# Patient Record
Sex: Male | Born: 1990 | Race: Black or African American | Hispanic: No | Marital: Married | Smoking: Never smoker
Health system: Southern US, Community
[De-identification: ages and names within clinical notes are randomized; demographics above are authoritative.]

---
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
## 2019-02-01 ENCOUNTER — Emergency Department (HOSPITAL_COMMUNITY)
Admission: EM | Admit: 2019-02-01 | Discharge: 2019-02-01 | Disposition: A | Discharge status: Home / Self Care | Payer: BC Managed Care – PPO | Attending: Emergency Medicine | Admitting: Emergency Medicine

## 2019-02-01 ENCOUNTER — Emergency Department (HOSPITAL_COMMUNITY): Payer: BC Managed Care – PPO

## 2019-02-01 ENCOUNTER — Encounter (HOSPITAL_COMMUNITY): Payer: Self-pay | Admitting: Emergency Medicine

## 2019-02-01 ENCOUNTER — Other Ambulatory Visit: Payer: Self-pay

## 2019-02-01 DIAGNOSIS — B349 Viral infection, unspecified: Secondary | ICD-10-CM | POA: Diagnosis not present

## 2019-02-01 DIAGNOSIS — R112 Nausea with vomiting, unspecified: Secondary | ICD-10-CM | POA: Insufficient documentation

## 2019-02-01 DIAGNOSIS — R17 Unspecified jaundice: Secondary | ICD-10-CM | POA: Diagnosis not present

## 2019-02-01 DIAGNOSIS — Z20828 Contact with and (suspected) exposure to other viral communicable diseases: Secondary | ICD-10-CM | POA: Diagnosis not present

## 2019-02-01 DIAGNOSIS — R079 Chest pain, unspecified: Secondary | ICD-10-CM | POA: Diagnosis present

## 2019-02-01 LAB — CBC
HCT: 45.7 % (ref 39.0–52.0)
Hemoglobin: 15.4 g/dL (ref 13.0–17.0)
MCH: 29.1 pg (ref 26.0–34.0)
MCHC: 33.7 g/dL (ref 30.0–36.0)
MCV: 86.4 fL (ref 80.0–100.0)
Platelets: 248 10*3/uL (ref 150–400)
RBC: 5.29 MIL/uL (ref 4.22–5.81)
RDW: 12.6 % (ref 11.5–15.5)
WBC: 5.4 10*3/uL (ref 4.0–10.5)
nRBC: 0 % (ref 0.0–0.2)

## 2019-02-01 LAB — COMPREHENSIVE METABOLIC PANEL
ALT: 23 U/L (ref 0–44)
AST: 31 U/L (ref 15–41)
Albumin: 4.5 g/dL (ref 3.5–5.0)
Alkaline Phosphatase: 44 U/L (ref 38–126)
Anion gap: 12 (ref 5–15)
BUN: 11 mg/dL (ref 6–20)
CO2: 20 mmol/L — ABNORMAL LOW (ref 22–32)
Calcium: 9.2 mg/dL (ref 8.9–10.3)
Chloride: 106 mmol/L (ref 98–111)
Creatinine, Ser: 1.09 mg/dL (ref 0.61–1.24)
GFR calc Af Amer: >60 mL/min (ref >60)
GFR calc non Af Amer: >60 mL/min (ref >60)
Glucose, Bld: 112 mg/dL — ABNORMAL HIGH (ref 70–99)
Potassium: 3.4 mmol/L — ABNORMAL LOW (ref 3.5–5.1)
Sodium: 138 mmol/L (ref 135–145)
Total Bilirubin: 2.7 mg/dL — ABNORMAL HIGH (ref 0.3–1.2)
Total Protein: 7.7 g/dL (ref 6.5–8.1)

## 2019-02-01 LAB — TROPONIN I (HIGH SENSITIVITY): Troponin I (High Sensitivity): <2 ng/L (ref <18)

## 2019-02-01 LAB — LIPASE, BLOOD: Lipase: 27 U/L (ref 11–51)

## 2019-02-01 MED ORDER — BENZONATATE 100 MG PO CAPS: 100.0000 mg | ORAL_CAPSULE | Freq: Three times a day (TID) | ORAL | 0 refills | Status: DC

## 2019-02-01 MED ORDER — ONDANSETRON 8 MG PO TBDP
8.0000 mg | ORAL_TABLET | Freq: Once | ORAL | Status: AC | PRN
  Administered 2019-02-01: 8 mg via ORAL
  Filled 2019-02-01: qty 1

## 2019-02-01 MED ORDER — ONDANSETRON 8 MG PO TBDP: 8.0000 mg | ORAL_TABLET | Freq: Three times a day (TID) | ORAL | 0 refills | Status: DC | PRN

## 2019-02-01 MED ORDER — ONDANSETRON 4 MG PO TBDP: 4.0000 mg | ORAL_TABLET | Freq: Once | ORAL | Status: DC | PRN

## 2019-02-01 NOTE — ED Provider Notes (Signed)
[facility]-EMERGENCY DEPT Provider Note   CSN: [id number] Arrival date & time: 02/01/19  1057     History   Chief Complaint Chief Complaint  Patient presents with  . Chest Pain  . Chills  . Cough  . Shortness of Breath  . Nausea    HPI James Wolfe is a 28 y.o. male.     HPI James Wolfe is a 28 y.o. male presents to emergency department with complaint of chest pain, nausea, vomiting, shortness of breath, chills onset this morning.  He denies any fever but states he felt feverish.  He denies any abdominal pain.  No hemoptysis.  No diarrhea.  He does report cough, dry, nonproductive.  No nasal congestion or sore throat.  He admitted to the nurse at triage that he drank alcohol last night, he denies this to me.  He denies sick contacts.  No close contact with anybody with COVID-19.  No medications taken prior to coming in.  No past medical problems.  History reviewed. No pertinent past medical history.  There are no active problems to display for this patient.   History reviewed. No pertinent surgical history.      Home Medications    Prior to Admission medications   Not on File    Family History No family history on file.  Social History Social History   Tobacco Use  . Smoking status: Never Smoker  . Smokeless tobacco: Never Used  Substance Use Topics  . Alcohol use: Yes  . Drug use: Yes    Types: Marijuana     Allergies   Patient has no known allergies.   Review of Systems Review of Systems  Constitutional: Positive for chills. Negative for fever.  HENT: Negative for congestion and sore throat.   Respiratory: Positive for cough. Negative for chest tightness and shortness of breath.   Cardiovascular: Positive for chest pain. Negative for palpitations and leg swelling.  Gastrointestinal: Positive for nausea. Negative for abdominal distention, abdominal pain, diarrhea and vomiting.  Genitourinary: Negative for dysuria, frequency,  hematuria and urgency.  Musculoskeletal: Positive for myalgias. Negative for neck pain and neck stiffness.  Skin: Negative for rash.  Allergic/Immunologic: Negative for immunocompromised state.  Neurological: Positive for headaches. Negative for dizziness, weakness, light-headedness and numbness.     Physical Exam Updated Vital Signs BP (!) 137/106 (BP Location: Left Arm)   Pulse 80   Temp 98.4 F (36.9 C) (Oral)   Resp 20   SpO2 100%   Physical Exam Vitals signs and nursing note reviewed.  Constitutional:      General: He is not in acute distress.    Appearance: He is well-developed.  HENT:     Head: Normocephalic and atraumatic.  Eyes:     Conjunctiva/sclera: Conjunctivae normal.  Neck:     Musculoskeletal: Neck supple.  Cardiovascular:     Rate and Rhythm: Normal rate and regular rhythm.     Heart sounds: Normal heart sounds.  Pulmonary:     Effort: Pulmonary effort is normal. No respiratory distress.     Breath sounds: No wheezing or rales.  Abdominal:     General: Bowel sounds are normal. There is no distension.     Palpations: Abdomen is soft.     Tenderness: There is no abdominal tenderness. There is no rebound.  Skin:    General: Skin is warm and dry.  Neurological:     Mental Status: He is alert.      ED Treatments /  Results  Labs (all labs ordered are listed, but only abnormal results are displayed) Labs Reviewed  COMPREHENSIVE METABOLIC PANEL - Abnormal; Notable for the following components:      Result Value   Potassium 3.4 (*)    CO2 20 (*)    Glucose, Bld 112 (*)    Total Bilirubin 2.7 (*)    All other components within normal limits  NOVEL CORONAVIRUS, NAA (HOSP ORDER, SEND-OUT TO REF LAB; TAT 18-24 HRS)  CBC  LIPASE, BLOOD  TROPONIN I (HIGH SENSITIVITY)  TROPONIN I (HIGH SENSITIVITY)    EKG EKG Interpretation  Date/Time:  Sunday February 01 2019 11:10:17 EDT Ventricular Rate:  81 PR Interval:    QRS Duration: 93 QT Interval:  392  QTC Calculation: 455 R Axis:   90 Text Interpretation:  Sinus rhythm Probable left atrial enlargement Consider right ventricular hypertrophy ST elev, probable normal early repol pattern Confirmed by Kohut, Stephen ([id number]) on 02/01/2019 1:05:49 PM   Radiology Dg Chest Portable 1 View  Result Date: 02/01/2019 CLINICAL DATA:  Chest pain, shortness of breath. EXAM: PORTABLE CHEST 1 VIEW COMPARISON:  None. FINDINGS: The heart size and mediastinal contours are within normal limits. Both lungs are clear. No pneumothorax or pleural effusion is noted. The visualized skeletal structures are unremarkable. IMPRESSION: No active disease. Electronically Signed   By: James  Green Jr M.D.   On: 02/01/2019 12:21    Procedures Procedures (including critical care time)  Medications Ordered in ED Medications  ondansetron (ZOFRAN-ODT) disintegrating tablet 8 mg (has no administration in time range)     Initial Impression / Assessment and Plan / ED Course  I have reviewed the triage vital signs and the nursing notes.  Pertinent labs & imaging results that were available during my care of the patient were reviewed by me and considered in my medical decision making (see chart for details).        12 :38 PM Patient seen and examined, patient with body aches, chills, cough, nausea, vomiting onset this morning.  Exam unremarkable.  He is slightly hypertensive, otherwise normal vital signs with no hypoxia or tachycardia.  He is afebrile.  Labs already obtained at triage including troponin, CBC, CMP, lipase.  We will continue to monitor patient, Zofran ordered for nausea.  Labs pending.  1:43 PM Labs show slight hypokalemia and elevated bilirubin 2.7, normal LFTs. Norma CBC. CXR negative as well. VS remain stable. COVID test obtained and pending. Pt stable to dc home. Most likely viral URI. Home with zofran and tessalon. Close return precautions discussed. Pt instructed to quarantine.   James Wolfe was  evaluated in Emergency Department on 02/01/2019 for the symptoms described in the history of present illness. He was evaluated in the context of the global COVID-19 pandemic, which necessitated consideration that the patient might be at risk for infection with the SARS-CoV-2 virus that causes COVID-19. Institutional protocols and algorithms that pertain to the evaluation of patients at risk for COVID-19 are in a state of rapid change based on information released by regulatory bodies including the CDC and federal and state organizations. These policies and algorithms were followed during the patient's care in the ED.  Vitals:   02/01/19 1112 02/01/19 1343  BP: (!) 137/106 128/88  Pulse: 80 72  Resp: 20 16  Temp: 98.4 F (36.9 C)   TempSrc: Oral   SpO2: 100% 100%      Final Clinical Impressions(s) / ED Diagnoses   Final diagnoses:  Viral syndrome  Non-intractable  vomiting with nausea, unspecified vomiting type  Elevated bilirubin    ED Discharge Orders         Ordered    ondansetron (ZOFRAN ODT) 8 MG disintegrating tablet  Every 8 hours PRN     02/01/19 1347    benzonatate (TESSALON) 100 MG capsule  Every 8 hours     02/01/19 1347           Jaynie CrumbleKirichenko, Jerimah Witucki, PA-C 02/01/19 1348    Raeford RazorKohut, Stephen, MD 02/01/19 1408

## 2019-02-01 NOTE — Discharge Instructions (Signed)
Tessalon as needed for cough. Zofran for nausea and vomiting. Rest. Drink plenty of fluids. Your bilirubin was up a little. Please have it rechecked with PCP in 3-4 wks. Return as needed

## 2019-02-01 NOTE — ED Triage Notes (Signed)
Pt c/o intermittent sharp chest pains that been intermittent over past couple weeks and  Today having SOB, chills, cough congestion.

## 2019-02-01 NOTE — ED Triage Notes (Signed)
Pt adds that he dry heaving. Pt reports that he did drink couple shots last night

## 2019-02-02 LAB — NOVEL CORONAVIRUS, NAA (HOSP ORDER, SEND-OUT TO REF LAB; TAT 18-24 HRS): SARS-CoV-2, NAA: NOT DETECTED

## 2019-12-06 ENCOUNTER — Ambulatory Visit (HOSPITAL_COMMUNITY)
Admission: EM | Admit: 2019-12-06 | Discharge: 2019-12-06 | Disposition: A | Discharge status: Home / Self Care | Payer: BC Managed Care – PPO

## 2019-12-06 ENCOUNTER — Other Ambulatory Visit: Payer: Self-pay

## 2019-12-06 NOTE — ED Notes (Signed)
James Wolfe, patient access, reports patient said they were leaving after told the expected wait time

## 2020-09-14 IMAGING — DX DG CHEST 1V PORT
1 series · 1 of 1 positions shown · non-contrast
Comparison: None.

CLINICAL DATA: Chest pain, shortness of breath.

EXAM:
PORTABLE CHEST 1 VIEW

[chest ap]
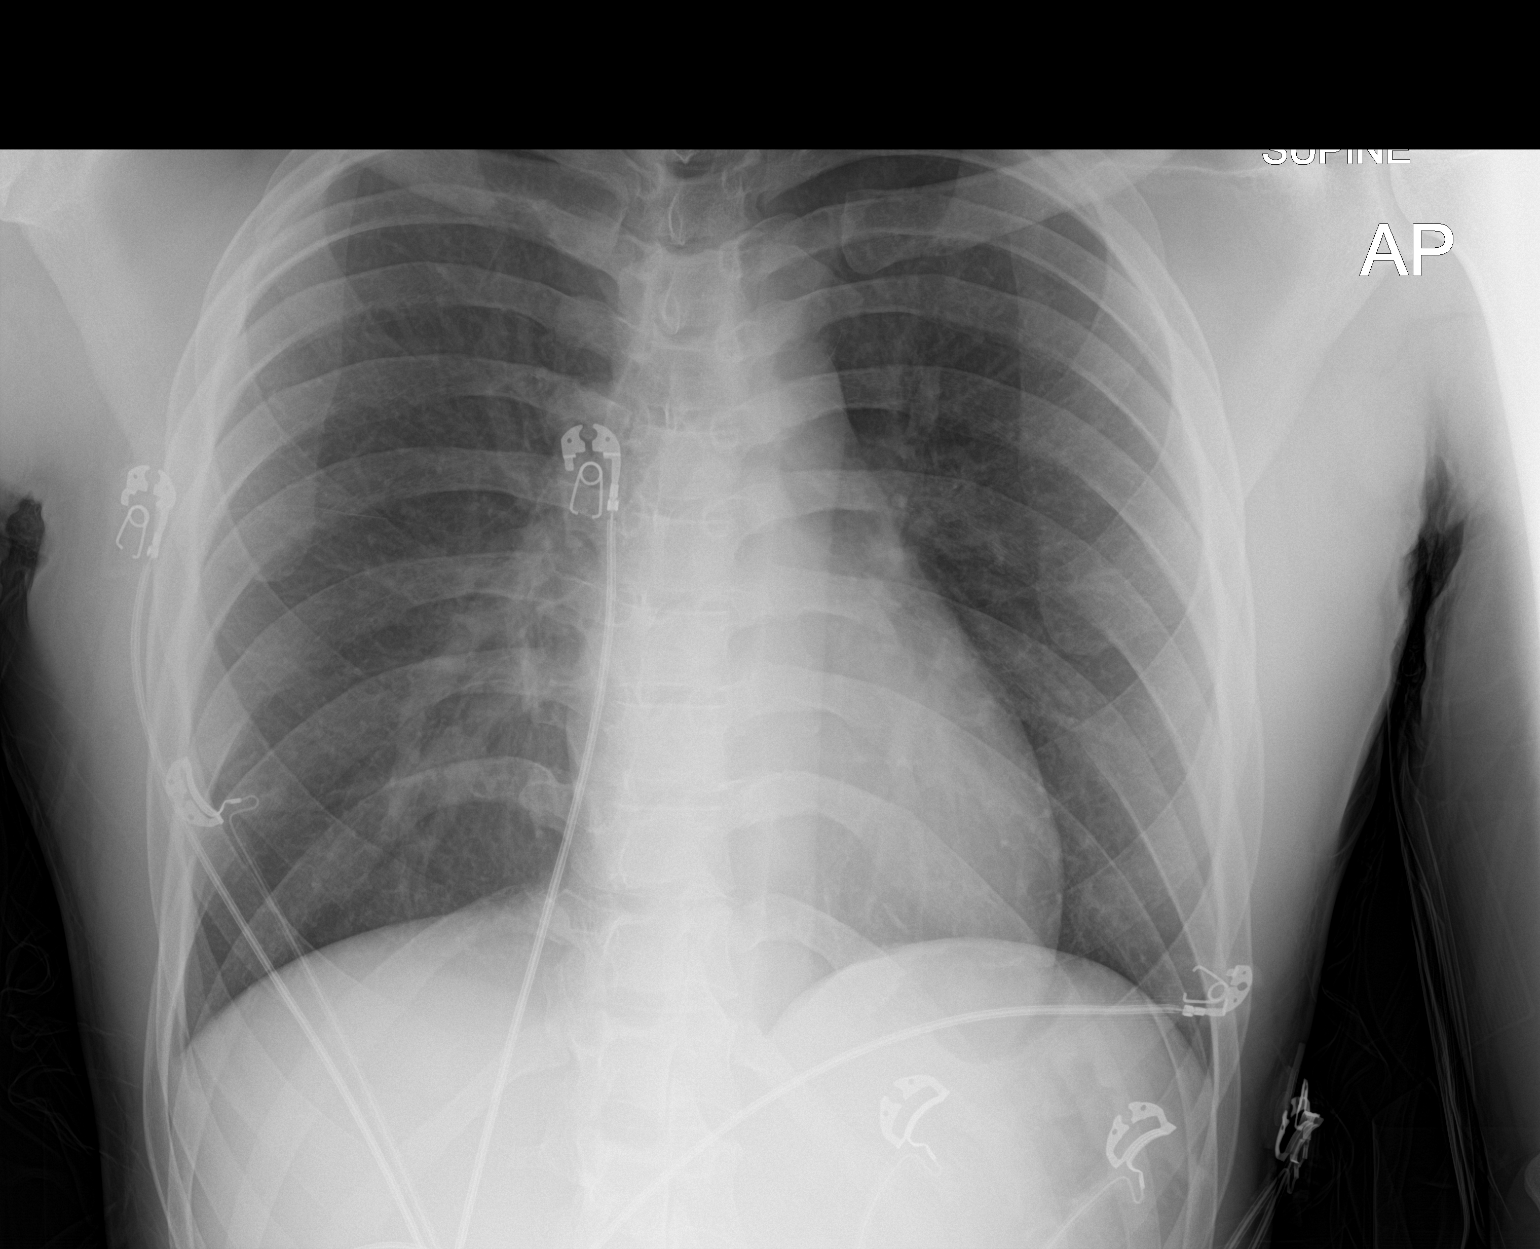

[1 of 1 positions shown; findings below may reference images not displayed]

FINDINGS: The heart size and mediastinal contours are within normal limits.
Both lungs are clear. No pneumothorax or pleural effusion is noted.
The visualized skeletal structures are unremarkable.
IMPRESSION: No active disease.

## 2021-06-23 ENCOUNTER — Other Ambulatory Visit: Payer: Self-pay

## 2021-06-23 ENCOUNTER — Emergency Department (HOSPITAL_COMMUNITY)
Admission: EM | Admit: 2021-06-23 | Discharge: 2021-06-23 | Disposition: A | Discharge status: Home / Self Care | Payer: BC Managed Care – PPO | Attending: Emergency Medicine | Admitting: Emergency Medicine

## 2021-06-23 ENCOUNTER — Emergency Department (HOSPITAL_COMMUNITY): Payer: BC Managed Care – PPO

## 2021-06-23 ENCOUNTER — Encounter (HOSPITAL_COMMUNITY): Payer: Self-pay | Admitting: Emergency Medicine

## 2021-06-23 DIAGNOSIS — J069 Acute upper respiratory infection, unspecified: Secondary | ICD-10-CM | POA: Diagnosis not present

## 2021-06-23 DIAGNOSIS — Z20822 Contact with and (suspected) exposure to covid-19: Secondary | ICD-10-CM | POA: Insufficient documentation

## 2021-06-23 DIAGNOSIS — J3489 Other specified disorders of nose and nasal sinuses: Secondary | ICD-10-CM | POA: Diagnosis not present

## 2021-06-23 DIAGNOSIS — R059 Cough, unspecified: Secondary | ICD-10-CM | POA: Diagnosis present

## 2021-06-23 LAB — RESP PANEL BY RT-PCR (FLU A&B, COVID) ARPGX2
Influenza A by PCR: NEGATIVE
Influenza B by PCR: NEGATIVE
SARS Coronavirus 2 by RT PCR: NEGATIVE

## 2021-06-23 MED ORDER — BENZONATATE 200 MG PO CAPS: 200.0000 mg | ORAL_CAPSULE | Freq: Three times a day (TID) | ORAL | 0 refills | Status: DC | PRN

## 2021-06-23 MED ORDER — ACETAMINOPHEN 500 MG PO TABS
1000.0000 mg | ORAL_TABLET | Freq: Once | ORAL | Status: AC
  Administered 2021-06-23: 1000 mg via ORAL
  Filled 2021-06-23: qty 2

## 2021-06-23 NOTE — Discharge Instructions (Addendum)
Your symptoms are likely caused by a viral upper respiratory infection. Antibiotics are not helpful in treating viral infection, the virus should run its course in about 5-7 days. Please make sure you are drinking plenty of fluids. You can treat your symptoms supportively with tylenol/ibuprofen for fevers and pains, Zyrtec and Flonase to help with nasal congestion, and prescribed cough medicine and throat lozenges to help with cough. If your symptoms are not improving please follow up with you Primary doctor.  ° °If you develop persistent fevers, shortness of breath or difficulty breathing, chest pain, severe headache and neck pain, persistent nausea and vomiting or other new or concerning symptoms return to the Emergency department. ° °

## 2021-06-23 NOTE — ED Provider Notes (Signed)
?[facility] EMERGENCY DEPARTMENT ?Provider Note ? ? ?CSN: [id number] ?Arrival date & time: 06/23/21  0037 ? ?  ? ?History ? ?Chief Complaint  ?Patient presents with  ? URI  ? ? ?James Wolfe is a 31 y.o. male. ? ?James Wolfe is a 31 y.o. male  ? ?The history is provided by the patient.  ? ?  ? ?Home Medications ?Prior to Admission medications   ?Medication Sig Start Date End Date Taking? Authorizing Provider  ?benzonatate (TESSALON) 200 MG capsule Take 1 capsule (200 mg total) by mouth 3 (three) times daily as needed for cough. 06/23/21  Yes Dartha Lodge, PA-C  ?ondansetron (ZOFRAN ODT) 8 MG disintegrating tablet Take 1 tablet (8 mg total) by mouth every 8 (eight) hours as needed for nausea or vomiting. 02/01/19   Jaynie Crumble, PA-C  ?   ? ?Allergies    ?Patient has no known allergies.   ? ?Review of Systems   ?Review of Systems  ?Constitutional:  Positive for chills. Negative for fever.  ?HENT:  Positive for congestion and rhinorrhea. Negative for sore throat.   ?Respiratory:  Positive for cough. Negative for shortness of breath and wheezing.   ?Cardiovascular:  Negative for chest pain.  ?Musculoskeletal:  Positive for myalgias.  ?Neurological:  Positive for headaches.  ?All other systems reviewed and are negative. ? ?Physical Exam ?Updated Vital Signs ?BP (!) 144/76 (BP Location: Right Arm)   Pulse 68   Temp 98.4 ?F (36.9 ?C) (Oral)   Resp 14   Ht 6' (1.829 m)   Wt 83.9 kg   SpO2 97%   BMI 25.09 kg/m?  ?Physical Exam ?Vitals and nursing note reviewed.  ?Constitutional:   ?   General: He is not in acute distress. ?   Appearance: Normal appearance. He is well-developed and normal weight. He is not ill-appearing or diaphoretic.  ?HENT:  ?   Head: Normocephalic and atraumatic.  ?   Nose: Congestion and rhinorrhea present.  ?   Mouth/Throat:  ?   Mouth: Mucous membranes are moist.  ?   Pharynx: Oropharynx is clear.  ?Eyes:  ?   General:     ?   Right eye: No discharge.     ?   Left eye: No  discharge.  ?Neck:  ?   Comments: No rigidity ?Cardiovascular:  ?   Rate and Rhythm: Normal rate and regular rhythm.  ?   Heart sounds: Normal heart sounds. No murmur heard. ?  No friction rub. No gallop.  ?Pulmonary:  ?   Effort: Pulmonary effort is normal. No respiratory distress.  ?   Breath sounds: Normal breath sounds.  ?   Comments: Respirations equal and unlabored, patient able to speak in full sentences, lungs clear to auscultation bilaterally  ?Abdominal:  ?   General: Bowel sounds are normal. There is no distension.  ?   Palpations: Abdomen is soft. There is no mass.  ?   Tenderness: There is no abdominal tenderness. There is no guarding.  ?   Comments: Abdomen soft, nondistended, nontender to palpation in all quadrants without guarding or peritoneal signs  ?Musculoskeletal:     ?   General: No deformity.  ?   Cervical back: Neck supple.  ?Lymphadenopathy:  ?   Cervical: No cervical adenopathy.  ?Skin: ?   General: Skin is warm and dry.  ?   Capillary Refill: Capillary refill takes less than 2 seconds.  ?Neurological:  ?   Mental Status: He  is alert and oriented to person, place, and time.  ?Psychiatric:     ?   Mood and Affect: Mood normal.     ?   Behavior: Behavior normal.  ? ? ?ED Results / Procedures / Treatments   ?Labs ?(all labs ordered are listed, but only abnormal results are displayed) ?Labs Reviewed  ?RESP PANEL BY RT-PCR (FLU A&B, COVID) [id number]  ? ? ?EKG ?None ? ?Radiology ?DG Chest 2 View ? ?Result Date: 06/23/2021 ?CLINICAL DATA:  Cough. EXAM: CHEST - 2 VIEW COMPARISON:  Chest radiograph dated 02/01/2019. FINDINGS: The heart size and mediastinal contours are within normal limits. Both lungs are clear. The visualized skeletal structures are unremarkable. IMPRESSION: No active cardiopulmonary disease. Electronically Signed   By: Elgie Collard M.D.   On: 06/23/2021 01:33   ? ?Procedures ?Procedures  ? ? ?Medications Ordered in ED ?Medications  ?acetaminophen (TYLENOL) tablet 1,000 mg (1,000 mg  Oral Given 06/23/21 0053)  ? ? ?ED Course/ Medical Decision Making/ A&P ?  ?                        ?This patient presents to the ED for concern of cough, congestion, chills, body aches, this involves an extensive number of treatment options, and is a complaint that carries with it a high risk of complications and morbidity.  The differential diagnosis includes COVID, flu, other viral upper respiratory infection, bronchitis, pneumonia ? ? ?Additional history obtained: ? ?Additional history obtained from significant other  ?External records from outside source obtained and reviewed including prior urgent care and ED visits. ? ? ?Lab Tests: ? ?I Ordered, and personally interpreted labs.  The pertinent results include:  neg for COVID and Flu ? ? ?Imaging Studies ordered: ? ?I ordered imaging studies including CXR  ?I independently visualized and interpreted imaging which showed no evidence of pneumonia ?I agree with the radiologist interpretation ? ? ?Medicines ordered and prescription drug management: ? ?I ordered medication including tylenol  for bodyaches  ?Reevaluation of the patient after these medicines showed that the patient improved ?I have reviewed the patients home medicines and have made adjustments as needed ? ? ? ?Problem List / ED Course: ? ?Presentation consistent with viral upper respiratory infection, Work-up is reassuring and vitals are normal. ?Stable for discharge home with continued symptomatic treatment and outpatient follow-up. ? ? ? ? ? ? ? ?Final Clinical Impression(s) / ED Diagnoses ?Final diagnoses:  ?Viral URI with cough  ? ? ?Rx / DC Orders ?ED Discharge Orders   ? ?      Ordered  ?  benzonatate (TESSALON) 200 MG capsule  3 times daily PRN       ? 06/23/21 0428  ? ?  ?  ? ?  ? ? ?  ?Dartha Lodge, [place] ?06/23/21 3016 ? ?  ?Nira Conn, MD ?06/23/21 [phone] ? ?

## 2021-06-23 NOTE — ED Triage Notes (Signed)
Pt reported to ED with c/o cough, weakness, body aches, chills for past few days. Pt denies any shortness of breath or nausea/vomiting.  ?

## 2022-09-14 ENCOUNTER — Other Ambulatory Visit: Payer: Self-pay

## 2022-09-14 ENCOUNTER — Ambulatory Visit (HOSPITAL_COMMUNITY)
Admission: EM | Admit: 2022-09-14 | Discharge: 2022-09-14 | Disposition: A | Discharge status: Home / Self Care | Payer: BC Managed Care – PPO | Attending: Emergency Medicine | Admitting: Emergency Medicine

## 2022-09-14 ENCOUNTER — Encounter (HOSPITAL_COMMUNITY): Payer: Self-pay | Admitting: Emergency Medicine

## 2022-09-14 DIAGNOSIS — A059 Bacterial foodborne intoxication, unspecified: Secondary | ICD-10-CM | POA: Diagnosis not present

## 2022-09-14 MED ORDER — ONDANSETRON 8 MG PO TBDP: 8.0000 mg | ORAL_TABLET | Freq: Three times a day (TID) | ORAL | 0 refills | Status: AC | PRN

## 2022-09-14 MED ORDER — ONDANSETRON 4 MG PO TBDP
ORAL_TABLET | ORAL | Status: AC
  Filled 2022-09-14: qty 1

## 2022-09-14 MED ORDER — ONDANSETRON 4 MG PO TBDP
4.0000 mg | ORAL_TABLET | Freq: Once | ORAL | Status: AC
  Administered 2022-09-14: 4 mg via ORAL

## 2022-09-14 NOTE — ED Triage Notes (Signed)
Pt ate at new restaurant last night and then began to feel bad. Has had 5 episodes of vomiting and 2 of diarrhea. No abdominal pain or fevers.  Last episodes vomiting 630 AM. NAD.

## 2022-09-14 NOTE — ED Provider Notes (Signed)
[facility]    CSN: [id number] Arrival date & time: 09/14/22  0815      History   Chief Complaint Chief Complaint  Patient presents with   Emesis   Diarrhea    HPI James Wolfe is a 32 y.o. male.   Patient reports needing it, last night and 2 hours later he developed abdominal cramping, nausea, vomiting and diarrhea.  He last vomited around 630 this morning.  Last episode of diarrhea was last night.  Since his last emesis episode, he has been keeping down water and ginger ale.  He denies fevers, or abdominal pain.     The history is provided by the patient and medical records.  Emesis Associated symptoms: diarrhea   Associated symptoms: no abdominal pain and no fever   Diarrhea Associated symptoms: vomiting   Associated symptoms: no abdominal pain and no fever     History reviewed. No pertinent past medical history.  There are no problems to display for this patient.   History reviewed. No pertinent surgical history.     Home Medications    Prior to Admission medications   Medication Sig Start Date End Date Taking? Authorizing Provider  ondansetron (ZOFRAN ODT) 8 MG disintegrating tablet Take 1 tablet (8 mg total) by mouth every 8 (eight) hours as needed for nausea or vomiting. 09/14/22   Kamerin Grumbine, [place] N, FNP    Family History History reviewed. No pertinent family history.  Social History Social History   Tobacco Use   Smoking status: Never   Smokeless tobacco: Never  Substance Use Topics   Alcohol use: Yes   Drug use: Yes    Types: Marijuana     Allergies   Patient has no known allergies.   Review of Systems Review of Systems  Constitutional:  Negative for fever.  Gastrointestinal:  Positive for diarrhea, nausea and vomiting. Negative for abdominal pain.     Physical Exam Triage Vital Signs ED Triage Vitals  Enc Vitals Group     BP 09/14/22 0830 (!) 133/90     Pulse Rate 09/14/22 0827 76     Resp 09/14/22 0827 16      Temp 09/14/22 0827 98.2 F (36.8 C)     Temp Source 09/14/22 0827 Oral     SpO2 09/14/22 0827 98 %     Weight 09/14/22 0827 195 lb (88.5 kg)     Height 09/14/22 0827 6' (1.829 m)     Head Circumference --      Peak Flow --      Pain Score 09/14/22 0827 0     Pain Loc --      Pain Edu? --      Excl. in GC? --    No data found.  Updated Vital Signs BP (!) 133/90   Pulse 76   Temp 98.2 F (36.8 C) (Oral)   Resp 16   Ht 6' (1.829 m)   Wt 195 lb (88.5 kg)   SpO2 98%   BMI 26.45 kg/m   Visual Acuity Right Eye Distance:   Left Eye Distance:   Bilateral Distance:    Right Eye Near:   Left Eye Near:    Bilateral Near:     Physical Exam Vitals and nursing note reviewed.  Constitutional:      Appearance: Normal appearance.  HENT:     Head: Normocephalic and atraumatic.     Right Ear: External ear normal.     Left Ear: External ear normal.  Nose: Nose normal.     Mouth/Throat:     Mouth: Mucous membranes are moist.  Eyes:     Conjunctiva/sclera: Conjunctivae normal.  Cardiovascular:     Rate and Rhythm: Normal rate and regular rhythm.     Heart sounds: Normal heart sounds. No murmur heard. Pulmonary:     Effort: Pulmonary effort is normal. No respiratory distress.     Breath sounds: Normal breath sounds.  Musculoskeletal:        General: No swelling. Normal range of motion.     Cervical back: Normal range of motion.  Skin:    General: Skin is warm and dry.  Neurological:     General: No focal deficit present.     Mental Status: He is alert and oriented to person, place, and time.  Psychiatric:        Mood and Affect: Mood normal.        Behavior: Behavior normal.      UC Treatments / Results  Labs (all labs ordered are listed, but only abnormal results are displayed) Labs Reviewed - No data to display  EKG   Radiology No results found.  Procedures Procedures (including critical care time)  Medications Ordered in UC Medications  ondansetron  (ZOFRAN-ODT) disintegrating tablet 4 mg (4 mg Oral Given 09/14/22 0856)    Initial Impression / Assessment and Plan / UC Course  I have reviewed the triage vital signs and the nursing notes.  Pertinent labs & imaging results that were available during my care of the patient were reviewed by me and considered in my medical decision making (see chart for details).  Vitals and triage reviewed, patient is hemodynamically stable.  Abdomen is soft and nontender with active bowel sounds.  Without concern for acute abdomen at this time.  H&P consistent with food poisoning, given Zofran in clinic and sent home with instructions for bland diet.  Plan of care, follow-up care, return precautions given, no questions at this time.     Final Clinical Impressions(s) / UC Diagnoses   Final diagnoses:  Food poisoning     Discharge Instructions      Your history and physical were consistent with food poisoning.  You can take the nausea medicine every 8 hours as needed.  Please eat and drink clear liquids like water, ginger ale, and progressed to broth and soups, if this goes well follow the follow the BRAT diet, this is bananas, rice, toast and applesauce.  Please return to clinic if you are unable to hold down food or fluids, develop fever, or do not improve over the next 3 to 5 days.      ED Prescriptions     Medication Sig Dispense Auth. Provider   ondansetron (ZOFRAN ODT) 8 MG disintegrating tablet Take 1 tablet (8 mg total) by mouth every 8 (eight) hours as needed for nausea or vomiting. 10 tablet Jessalynn Mccowan, [place] N, FNP      PDMP not reviewed this encounter.   Rinaldo Ratel [place] N, [place] 09/14/22 [phone]

## 2022-09-14 NOTE — Discharge Instructions (Addendum)
Your history and physical were consistent with food poisoning.  You can take the nausea medicine every 8 hours as needed.  Please eat and drink clear liquids like water, ginger ale, and progressed to broth and soups, if this goes well follow the follow the BRAT diet, this is bananas, rice, toast and applesauce.  Please return to clinic if you are unable to hold down food or fluids, develop fever, or do not improve over the next 3 to 5 days.
